# Patient Record
Sex: Female | Born: 1995 | Race: Black or African American | Hispanic: No | Marital: Single | Smoking: Never smoker
Health system: Southern US, Community
[De-identification: ages and names within clinical notes are randomized; demographics above are authoritative.]

## PROBLEM LIST (undated history)

## (undated) DIAGNOSIS — Z973 Presence of spectacles and contact lenses: Secondary | ICD-10-CM

## (undated) DIAGNOSIS — D259 Leiomyoma of uterus, unspecified: Secondary | ICD-10-CM

## (undated) HISTORY — PX: NO PAST SURGERIES: SHX2092

---

## 2015-03-15 ENCOUNTER — Emergency Department (HOSPITAL_COMMUNITY)
Admission: EM | Admit: 2015-03-15 | Discharge: 2015-03-15 | Disposition: A | Discharge status: Home / Self Care | Payer: BLUE CROSS/BLUE SHIELD | Attending: Emergency Medicine | Admitting: Emergency Medicine

## 2015-03-15 ENCOUNTER — Emergency Department (HOSPITAL_COMMUNITY): Payer: BLUE CROSS/BLUE SHIELD

## 2015-03-15 DIAGNOSIS — R002 Palpitations: Secondary | ICD-10-CM | POA: Insufficient documentation

## 2015-03-15 DIAGNOSIS — R079 Chest pain, unspecified: Secondary | ICD-10-CM | POA: Diagnosis not present

## 2015-03-15 DIAGNOSIS — R000 Tachycardia, unspecified: Secondary | ICD-10-CM | POA: Insufficient documentation

## 2015-03-15 DIAGNOSIS — L239 Allergic contact dermatitis, unspecified cause: Secondary | ICD-10-CM | POA: Diagnosis not present

## 2015-03-15 DIAGNOSIS — Z79899 Other long term (current) drug therapy: Secondary | ICD-10-CM | POA: Diagnosis not present

## 2015-03-15 LAB — I-STAT TROPONIN, ED: TROPONIN I, POC: 0 ng/mL (ref 0.00–0.08)

## 2015-03-15 LAB — CBC
HCT: 35.9 % — ABNORMAL LOW (ref 36.0–46.0)
Hemoglobin: 12.4 g/dL (ref 12.0–15.0)
MCH: 31.1 pg (ref 26.0–34.0)
MCHC: 34.5 g/dL (ref 30.0–36.0)
MCV: 90 fL (ref 78.0–100.0)
PLATELETS: 213 10*3/uL (ref 150–400)
RBC: 3.99 MIL/uL (ref 3.87–5.11)
RDW: 12.7 % (ref 11.5–15.5)
WBC: 5.7 10*3/uL (ref 4.0–10.5)

## 2015-03-15 LAB — BASIC METABOLIC PANEL
Anion gap: 6 (ref 5–15)
BUN: 10 mg/dL (ref 6–20)
CALCIUM: 9.1 mg/dL (ref 8.9–10.3)
CHLORIDE: 107 mmol/L (ref 101–111)
CO2: 24 mmol/L (ref 22–32)
CREATININE: 0.75 mg/dL (ref 0.44–1.00)
Glucose, Bld: 93 mg/dL (ref 65–99)
Potassium: 4.2 mmol/L (ref 3.5–5.1)
SODIUM: 137 mmol/L (ref 135–145)

## 2015-03-15 LAB — TROPONIN I

## 2015-03-15 LAB — D-DIMER, QUANTITATIVE: D-Dimer, Quant: 1.58 ug/mL-FEU — ABNORMAL HIGH (ref 0.00–0.48)

## 2015-03-15 MED ORDER — PREDNISONE 20 MG PO TABS
40.0000 mg | ORAL_TABLET | Freq: Once | ORAL | Status: AC
  Administered 2015-03-15: 40 mg via ORAL
  Filled 2015-03-15: qty 2

## 2015-03-15 MED ORDER — PREDNISONE 20 MG PO TABS: 40.0000 mg | ORAL_TABLET | Freq: Every day | ORAL | Status: DC

## 2015-03-15 MED ORDER — IOHEXOL 350 MG/ML SOLN
100.0000 mL | Freq: Once | INTRAVENOUS | Status: AC | PRN
  Administered 2015-03-15: 56 mL via INTRAVENOUS

## 2015-03-15 NOTE — Discharge Instructions (Signed)
Please return if you experience shortness of breath, worsening rash, new or worsening symptoms, or additional concerns. Please take all medications as prescribed.  It is important to have a primary doctor to ensure healthy living. I have attached resources to help you find a provider in the area.    Emergency Department Resource Guide 1) Find a Doctor and Pay Out of Pocket Although you won't have to find out who is covered by your insurance plan, it is a good idea to ask around and get recommendations. You will then need to call the office and see if the doctor you have chosen will accept you as a new patient and what types of options they offer for patients who are self-pay. Some doctors offer discounts or will set up payment plans for their patients who do not have insurance, but you will need to ask so you aren't surprised when you get to your appointment.  2) Contact Your Local Health Department Not all health departments have doctors that can see patients for sick visits, but many do, so it is worth a call to see if yours does. If you don't know where your local health department is, you can check in your phone book. The CDC also has a tool to help you locate your state's health department, and many state websites also have listings of all of their local health departments.  3) Find a Walk-in Clinic If your illness is not likely to be very severe or complicated, you may want to try a walk in clinic. These are popping up all over the country in pharmacies, drugstores, and shopping centers. They're usually staffed by nurse practitioners or physician assistants that have been trained to treat common illnesses and complaints. They're usually fairly quick and inexpensive. However, if you have serious medical issues or chronic medical problems, these are probably not your best option.  No Primary Care Doctor: - Call Health Connect at  [phone] - they can help you locate a primary care doctor that   accepts your insurance, provides certain services, etc. - Physician Referral Service- [phone]  Chronic Pain Problems: Organization         Address  Phone   Notes  [facility]  [phone] Patients need to be referred by their primary care doctor.   Medication Assistance: Organization         Address  Phone   Notes  [facility] Medication [facility] [address] [place], [place] [address] [phone] --Must be a resident of [facility] [place] -- Must have NO insurance coverage whatsoever (no Medicaid/ Medicare, etc.) -- The pt. MUST have a primary care doctor that directs their care regularly and follows them in the community   MedAssist  [phone]   [employer]  [phone]    Agencies that provide inexpensive medical care: Organization         Address  Phone   Notes  [facility]  [phone]   [facility]    [phone]) [phone]   [facility] [address] [place], [place] [address] [phone]   [facility] [address], [place] [phone]   Planned Parenthood    [phone]   [facility]    [phone]   [facility]  [address], [place] Phone:  [phone], Fax:  [phone] Hours of Operation:  9  am - 6 pm, M-F.  Also accepts Medicaid/Medicare and self-pay.  [facility] for [place] [address], [address], [place] Phone: [phone], Fax: [phone]. Hours of Operation:  8:30 am - 5:30 pm, M-F.  Also accepts Medicaid and self-pay.  [facility] [place] [address], [place] Phone: [phone]   [place], [place], [place] [phone], Ext. [phone] Mondays & Thursdays: 7-9 AM.  First 15 patients are seen on a first come, first serve basis.    [place] Providers:  Organization          Address  Phone   Notes  [facility] [address], [place] [phone] Also accepts self-pay patients.  [facility] [address], [place]  [phone]   [place], [address], [place] [phone]   [facility] [address], [place] [phone]   Lucianne Lei [address], [place]   [phone] Only accepts [place] Access [place] patients after they have their name applied to their card.   Self-Pay (no insurance) in [facility]:  Organization         Address  Phone   Notes  Sickle Cell Patients, [facility] [place] [phone]   [facility] [place] [phone]   [facility] [place]  [place], [place], [place] [phone]   [facility]/Dr. Osei-Bonsu  [address], [place] or [address], [place] [phone] Phone number for both [place] and [place] locations is the same.  [facility] [address], [place] [phone]   [facility] [address], [place] or [address] [phone]   [facility] [address], [place] [phone], phone; [phone], fax Sees patients 1st and 3rd Saturday of every month.  Must not qualify for public or private insurance (i.e. Medicaid, Medicare, [place] Health Choice, Veterans' Benefits)  Household income should be no more than 200% of the poverty level The clinic cannot treat you if you are pregnant or think you are pregnant  Sexually transmitted diseases are not treated at the clinic.    Dental Care: Organization         Address  Phone  Notes  [facility] [place] [phone] Accepts children up to age 52 who are enrolled in [place] or [place]; pregnant women with a Medicaid card; and children who have applied for Medicaid or [place] Health Choice, but were declined, whose parents can pay a reduced fee at time of service.  [facility] Department of [facility]  [address], [place] [phone] Accepts children up to age 78 who are enrolled in [place] or [place]; pregnant women with a Medicaid card; and children who have applied for Medicaid or  Health Choice, but were declined, whose parents can pay a reduced fee at time of service.  [facility] Access PRO[facility]  [place] [phone] Patients are seen by appointment only. Walk-ins are not accepted. [place] will see patients 18 years of  age and older. Monday - Tuesday (8am-5pm) Most Wednesdays (8:30-5pm) $30 per visit, cash only  [facility] Adult Dental Access PROGRAM  [address], [facility] [phone] Patients are seen by appointment only. Walk-ins are not accepted. [place] will see patients 57 years of age and older. One Wednesday Evening (Monthly: Volunteer Based).  $30 per visit, cash only  [place]  [phone] for adults; Children under age 78, call Graduate Pediatric Dentistry at [phone]. Children aged 43-14, please call [phone] to request a pediatric application.  Dental services are provided in all areas of dental care including fillings, crowns and bridges, complete and partial dentures, implants, gum treatment, root canals, and extractions. Preventive care is also provided. Treatment is provided to both adults and children. Patients are selected via a lottery and there is often a waiting list.   [facility] [address] [place]  [phone] [email or web address]   [facility] [address] [place], [place]  [phone], Ext. [phone] Second and Fourth Thursday of each month, opens at 6:30 AM; Clinic ends at 9 AM.  Patients are seen on a first-come first-served basis, and a limited number are seen during each clinic.   [facility]  [address] Hillard Danker [place], [place] [phone]   Eligibility Requirements You must have lived in [place], [place], or [place] counties for at least the last three months.   You cannot be eligible for state or federal sponsored [employer], including [employer], [place], or [employer].   You generally cannot be eligible for healthcare insurance through your employer.    How to apply: Eligibility screenings are held every Tuesday and Wednesday afternoon from 1:00 pm until 4:00 pm. You do not need an appointment for the interview!  [facility] [address] [place], [place]   [place]  [place] Department  [place]  [phone]    Behavioral Health Resources in the Community: Intensive Outpatient Programs Organization         Address  Phone  Notes  [place]. [address], [place], [place] [phone]   [facility] Outpatient [address], [place], [place]   ADS: Alcohol & Drug Svcs [address] [place], [place]   [place] [address],  [place], [place] or [phone]   Substance Abuse Resources Organization         Address  Phone  Notes  Alcohol and Drug Services  [phone]   [place]  [phone]   The [place]   Chinita Pester  [phone]   Residential & Outpatient Substance Abuse Program  [phone]   Psychological Services Organization         Address  Phone  Notes  [facility] [place]  [place]  [phone]    [facility] [address] [place] or [phone]    Mobile Crisis Teams Organization         Address  Phone  Notes  [facility], Mobile Crisis Care Unit  [phone]   [facility] Psychotherapeutic Services  [address] [place], [place]   [facility] [address] [place] [phone]    Self-Help/Support Groups Organization  Address  Phone             Notes  [place]. of [place] - variety of support groups  [place] Call for more information  Narcotics Anonymous (NA), [facility]aring [facility] [address], [employer] [place]  2 meetings at this location   Special educational needs teacher         Address  Phone  Notes  [facility]idential [facility] [place],    [place]  [phone]   [facility]  [address] [place] [address]250, [place], [place]   [place], [place] [phone] Admissions: 8am-3pm M-F  Incentives Substance [place] [address],    [place], [place] [phone]   [facility] [address] [place], [place], [place]   [facility] [address]  [place], [place]   [facility]ight Programs - Intensive Outpatient [address], [place], [place]   [facility] ([place].,  [place], [place] [phone] or [phone]   [facility]idential Treatment Services (RTS) [address] [place], [place] Accepts Medicaid  [facility] [place] [address]  [place] [phone] Substance Abuse/Addiction Treatment   [facility] Organization         Address  Phone  Notes  [facility]  [phone]   Domenic Schwab, PhD [address] Arlis [place], [place]   [phone] or [phone]   [place]  [place], [place] [phone]   [facility] [address] [place], [place] [phone] Insurance/Medicaid/sponsorship through [facility] and Families [address] [place]                                    [place], [place] [phone] [place] [address], [place] [phone]    Dr. Adele Schilder  [phone]   [facility] Dept. 1) [address], [place] 2) [place] 3)  [address], [place] [phone]  [phone]   [place] [phone] or [phone] (After Hours)

## 2015-03-15 NOTE — ED Provider Notes (Signed)
The pt has central CP - started while she is class.  No hx of CAD or other RF for PE / ACS.   Has clear lungs and heart sounds, no edema,  The patient does use oral contraceptive pills, she is low risk for other sources of chest pain, will add d-dimer, labs and x-ray reviewed and thus far are normal.  ED ECG REPORT  I personally interpreted this EKG   Date: 03/16/2015   Rate: 82  Rhythm: normal sinus rhythm  QRS Axis: normal  Intervals: normal  ST/T Wave abnormalities: normal  Conduction Disutrbances:none  Narrative Interpretation:   Old EKG Reviewed: none available  Medical screening examination/treatment/procedure(s) were conducted as a shared visit with non-physician practitioner(s) and myself.  I personally evaluated the patient during the encounter.  Clinical Impression:   Final diagnoses:  Chest pain, unspecified chest pain type  Allergic dermatitis         Eber HongBrian Aylee Littrell, MD 03/16/15 [phone]

## 2015-03-15 NOTE — ED Provider Notes (Signed)
CSN: [id number]     Arrival date & time 03/15/15  1159 History   First MD Initiated Contact with Patient 03/15/15 1209     Chief Complaint  Patient presents with  . Chest Pain     (Consider location/radiation/quality/duration/timing/severity/associated sxs/prior Treatment) Patient is a 19 y.o. female presenting with chest pain. The history is provided by the patient and medical records. No language interpreter was used.  Chest Pain Pain location:  Substernal area Pain quality: throbbing   Pain radiates to:  L arm Pain radiates to the back: no   Onset quality:  Sudden Timing:  Intermittent Progression:  Resolved Chronicity:  New Relieved by:  None tried Worsened by:  Movement Ineffective treatments:  None tried Associated symptoms: palpitations   Associated symptoms: no abdominal pain, no back pain, no cough, no dizziness, no headache, no nausea, no shortness of breath, not vomiting and no weakness   Risk factors: birth control   Radiation of pain in left arm is vague, not as severe, and does not always occur.     No past medical history on file. No past surgical history on file. No family history on file. Social History  Substance Use Topics  . Smoking status: Not on file  . Smokeless tobacco: Not on file  . Alcohol Use: Not on file   OB History    No data available     Review of Systems  Constitutional: Negative.   HENT: Negative for congestion, hearing loss, rhinorrhea and sore throat.   Eyes: Negative for visual disturbance.  Respiratory: Negative for cough, shortness of breath and wheezing.   Cardiovascular: Positive for chest pain and palpitations.  Gastrointestinal: Negative for nausea, vomiting, abdominal pain, diarrhea and constipation.  Endocrine: Negative for polydipsia and polyuria.  Musculoskeletal: Negative for myalgias, back pain, arthralgias and neck pain.  Skin: Negative for rash.  Neurological: Negative for dizziness, weakness and headaches.       Allergies  Review of patient's allergies indicates not on file.  Home Medications   Prior to Admission medications   Medication Sig Start Date End Date Taking? Authorizing Provider  JUNEL FE 1/20 1-20 MG-MCG tablet Take 1 tablet by mouth daily. 02/19/15  Yes Historical Provider, MD   BP 111/64 mmHg  Pulse 83  Temp(Src) 99.1 F (37.3 C) (Oral)  Resp 19  Ht  (1.6 m)  Wt 123 lb (55.792 kg)  BMI 21.79 kg/m2  SpO2 100%  LMP 02/22/2015 (Exact Date) Physical Exam  Constitutional: She is oriented to person, place, and time. She appears well-developed and well-nourished.  Alert and in no acute distress  HENT:  Head: Normocephalic and atraumatic.  Cardiovascular: Normal rate, regular rhythm, normal heart sounds and intact distal pulses.  Exam reveals no gallop and no friction rub.   No murmur heard. On re-eval exam, tacky but regular.  Pulmonary/Chest: Effort normal and breath sounds normal. No respiratory distress. She has no wheezes. She has no rales. She exhibits no tenderness.  Abdominal: Soft. Bowel sounds are normal. She exhibits no mass. There is no rebound and no guarding.  Abdomen soft, non-tender, non-distended Bowel sounds positive in all four quadrants   Musculoskeletal: She exhibits no edema.  Neurological: She is alert and oriented to person, place, and time.  Skin: Skin is warm and dry. No rash noted.  Psychiatric: She has a normal mood and affect. Her behavior is normal. Judgment and thought content normal.    ED Course  Procedures (including critical care time) Labs  Review Labs Reviewed  CBC - Abnormal; Notable for the following:    HCT 35.9 (*)    All other components within normal limits  D-DIMER, QUANTITATIVE (NOT AT [facility]) - Abnormal; Notable for the following:    D-Dimer, Quant 1.58 (*)    All other components within normal limits  BASIC METABOLIC PANEL  TROPONIN I  [place] Sensor, ED    Imaging Review Dg Chest 2 View  03/15/2015   CLINICAL DATA:  Initial encounter:  Chest pain EXAM: CHEST  2 VIEW COMPARISON:  None. FINDINGS: Lungs are clear. Heart size and pulmonary vascularity are normal. No adenopathy. No pneumothorax. No focal bone lesions. There is slight mid thoracic dextroscoliosis. IMPRESSION: No edema or consolidation. Electronically Signed   By: Bretta Bang III M.D.   On: 03/15/2015 13:28   Ct Angio Chest Pe W/cm &/or Wo Cm  03/15/2015  CLINICAL DATA:  Pain in the mid and upper chest radiating to the left arm, with sudden onset this morning. EXAM: CT ANGIOGRAPHY CHEST WITH CONTRAST TECHNIQUE: Multidetector CT imaging of the chest was performed using the standard protocol during bolus administration of intravenous contrast. Multiplanar CT image reconstructions and MIPs were obtained to evaluate the vascular anatomy. CONTRAST:  56mL OMNIPAQUE IOHEXOL 350 MG/ML SOLN COMPARISON:  Chest radiograph dated 03/15/2015 FINDINGS: There is no evidence of pulmonary embolus. There is mild pericardial thickening at the apex of the heart. There is no evidence of significant focal lung parenchymal consolidation, pleural effusion or pneumothorax. No masses are identified ; no abnormal focal contrast enhancement is seen. No mediastinal or hilar lymphadenopathy. No axillary lymphadenopathy is seen. The visualized portions of the thyroid gland are unremarkable in appearance. The visualized portions of the liver and spleen are unremarkable. The visualized portions of the pancreas, gallbladder, stomach, adrenal glands and kidneys are within normal limits. No acute osseous abnormalities are seen. Review of the MIP images confirms the above findings. IMPRESSION: No evidence of pulmonary embolus. Minimal pericardial thickening at the apex of the heart. Please correlate clinically. No evidence of pulmonary disease. Electronically Signed   By: Ted Mcalpine M.D.   On: 03/15/2015 15:33   I have personally reviewed and evaluated these images and  lab results as part of my medical decision-making.   EKG Interpretation   Date/Time:  Wednesday March 15 2015 12:18:38 EDT Ventricular Rate:  82 PR Interval:  157 QRS Duration: 66 QT Interval:  342 QTC Calculation: 399 R Axis:   72 Text Interpretation:  Sinus rhythm Normal ECG No old tracing to compare  Confirmed by MILLER  MD, BRIAN ([id number]) on 03/15/2015 3:33:50 PM      MDM   Final diagnoses:  None   Donna Faulkner presents to ED for chest pain. Not exertional, intermittent, not currently in pain, no cardiac history, no cardiac risk factors. HEART score of 0 - cardiac etiology unlikely.   EKG, CXR and labs reviewed; no delta wave on EKG, CXR with no acute cardiopulm process, Troponin negative, CBC WDL other than HCT of 35.9   1:36 PM - Pt. Re-evaluated, tacky at 108 while in room. Considering this HR, Well's score is 1.5 (low-risk) ; Patient had an episode of discomfort while here that resolved in less than one minute. Patient on OCP - will add d-dimer to rule out PE since low risk but no known etiology of pain at this time.   2:33 PM - D dimer returned + 1.58, will do CT angio to rule out PE 2:45 PM -  Patient re-evaluated; 100% o2, no SOB. Results of d-dimer discussed, patient verbalizes understanding and agrees CT.   3:11 PM - Nurse informs that patient is complaining of rash. Patient re-evaluated and swelling, erythema of left perioral region and neck is noted. + itching. This occurred before IV contrast was given. No new soaps, lotions, hair products, etc. Will give prednisone   3:41 PM - CT reviewed no evidence of PE. Discussed discharge with patient and informed she and mother of reasons to return. Both verbalized understanding.   Chase PicketJaime Pilcher Ward, PA-C        [facility]oscopy[facility] Pilcher Ward, [place] JerseyPA-C 03/15/15 1546  Eber HongBrian Miller, MD 03/16/15 [phone]

## 2015-03-15 NOTE — ED Notes (Addendum)
Per pt, new onset chest pain 6/10 during class at 10am today. Pain in mid upper chest radiating to left arm at times. No medical hx.

## 2016-09-03 IMAGING — CR DG CHEST 2V
2 series · 2 of 2 positions shown · non-contrast
Comparison: None.

CLINICAL DATA: Initial encounter:  Chest pain

EXAM:
CHEST  2 VIEW

[w chest pa]
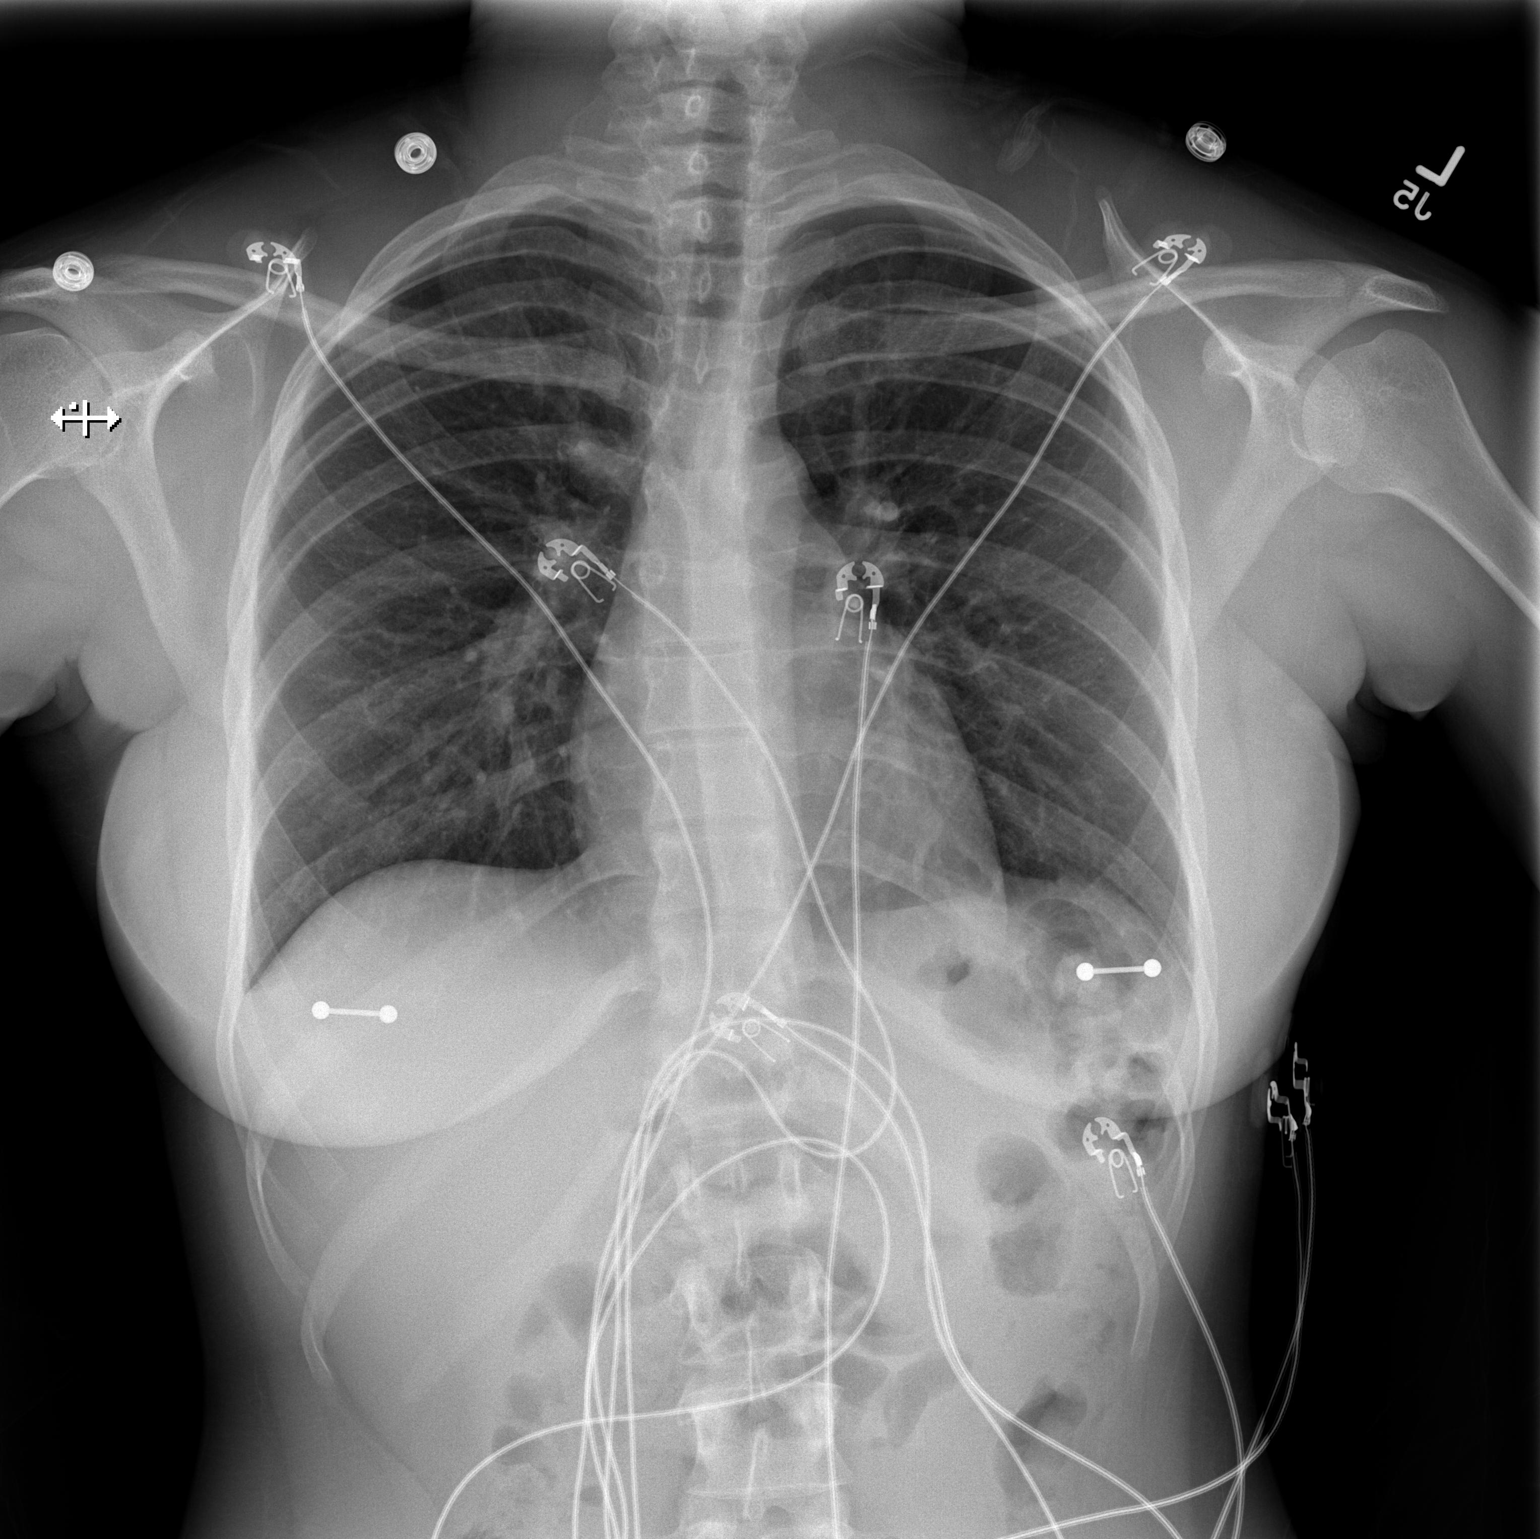

[w chest lat]
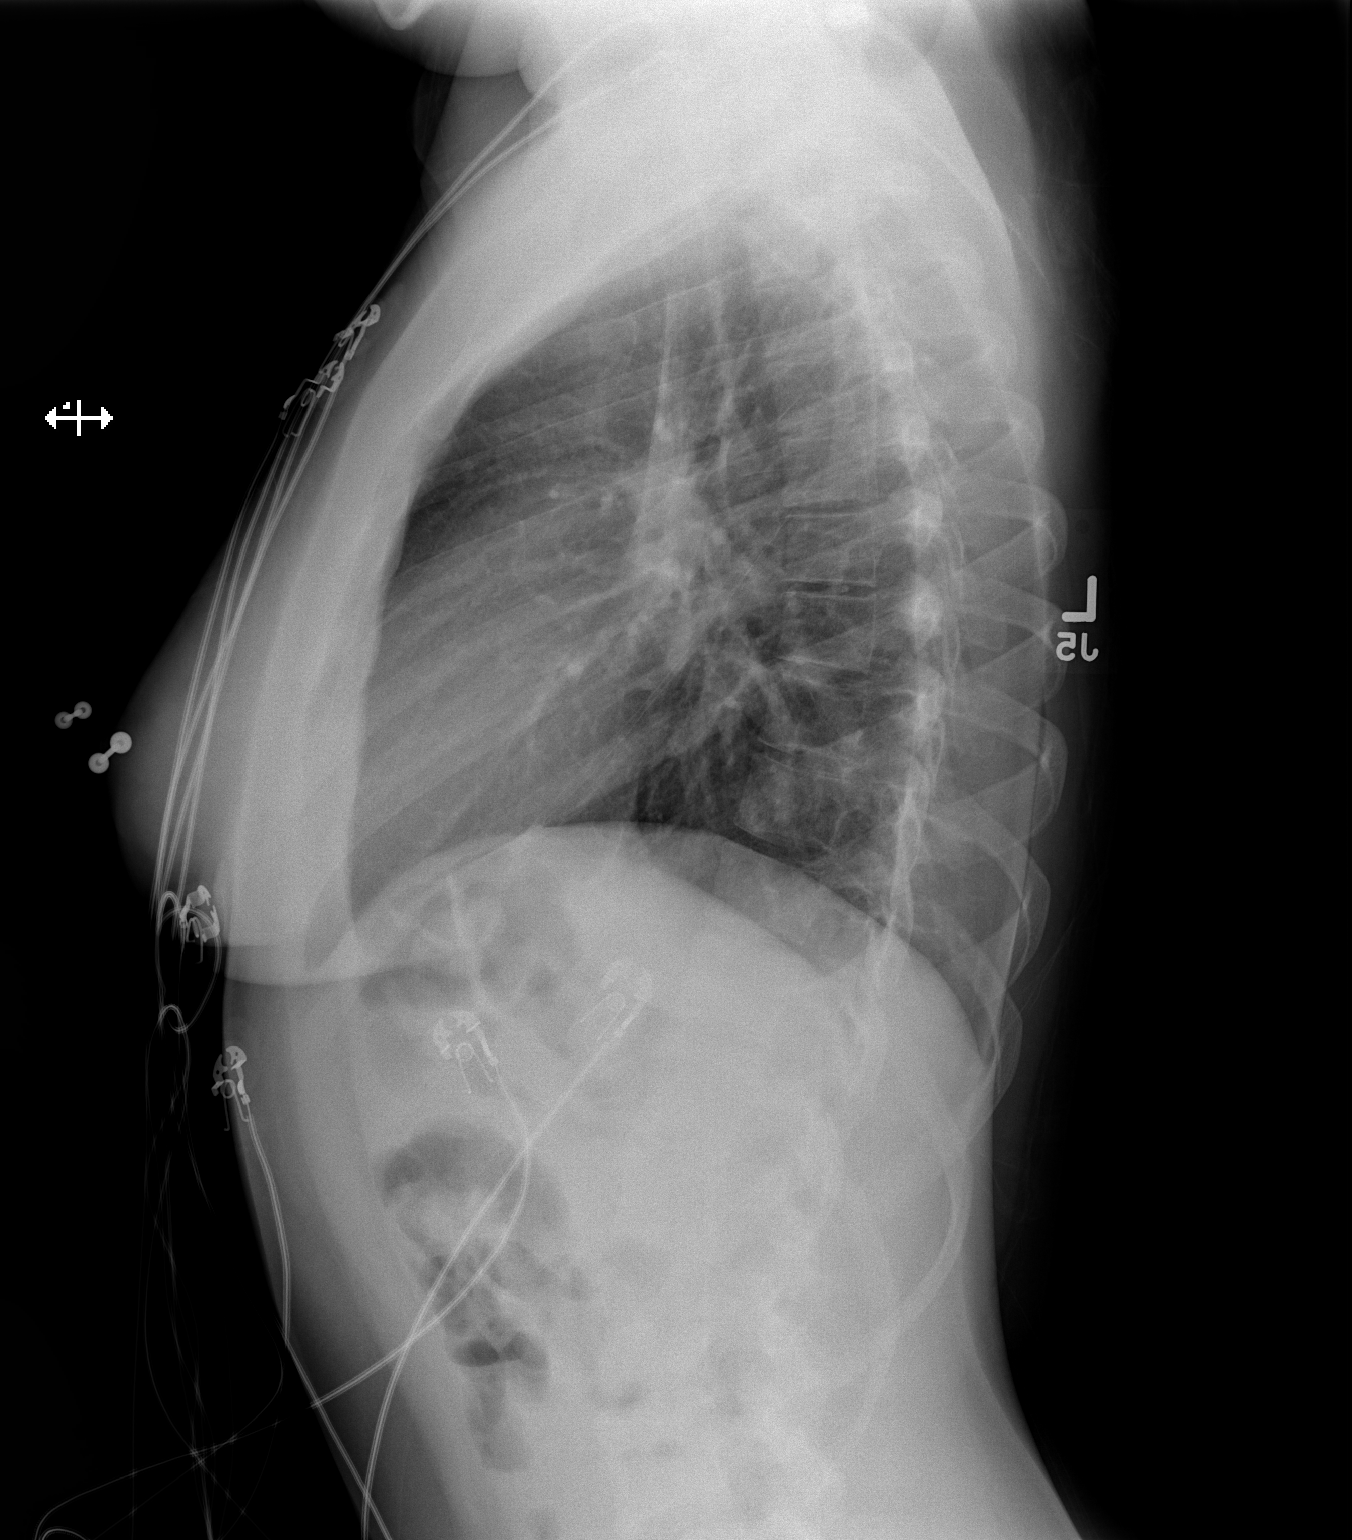

[2 of 2 positions shown; findings below may reference images not displayed]

FINDINGS: Lungs are clear. Heart size and pulmonary vascularity are normal. No
adenopathy. No pneumothorax. No focal bone lesions. There is slight
mid thoracic dextroscoliosis.
IMPRESSION: No edema or consolidation.

## 2018-05-20 DIAGNOSIS — Z87442 Personal history of urinary calculi: Secondary | ICD-10-CM

## 2018-05-20 HISTORY — DX: Personal history of urinary calculi: Z87.442

## 2019-02-13 ENCOUNTER — Emergency Department (HOSPITAL_COMMUNITY): Payer: Managed Care, Other (non HMO)

## 2019-02-13 ENCOUNTER — Other Ambulatory Visit: Payer: Self-pay

## 2019-02-13 ENCOUNTER — Other Ambulatory Visit: Payer: Self-pay | Admitting: Physician Assistant

## 2019-02-13 ENCOUNTER — Emergency Department (HOSPITAL_COMMUNITY)
Admission: EM | Admit: 2019-02-13 | Discharge: 2019-02-13 | Disposition: A | Discharge status: Home / Self Care | Payer: Managed Care, Other (non HMO) | Attending: Emergency Medicine | Admitting: Emergency Medicine

## 2019-02-13 ENCOUNTER — Encounter (HOSPITAL_COMMUNITY): Payer: Self-pay | Admitting: Emergency Medicine

## 2019-02-13 DIAGNOSIS — N200 Calculus of kidney: Secondary | ICD-10-CM | POA: Diagnosis not present

## 2019-02-13 DIAGNOSIS — N760 Acute vaginitis: Secondary | ICD-10-CM | POA: Insufficient documentation

## 2019-02-13 DIAGNOSIS — Z113 Encounter for screening for infections with a predominantly sexual mode of transmission: Secondary | ICD-10-CM | POA: Insufficient documentation

## 2019-02-13 DIAGNOSIS — E876 Hypokalemia: Secondary | ICD-10-CM | POA: Diagnosis not present

## 2019-02-13 DIAGNOSIS — R1011 Right upper quadrant pain: Secondary | ICD-10-CM | POA: Diagnosis present

## 2019-02-13 DIAGNOSIS — Z79899 Other long term (current) drug therapy: Secondary | ICD-10-CM | POA: Diagnosis not present

## 2019-02-13 DIAGNOSIS — B9689 Other specified bacterial agents as the cause of diseases classified elsewhere: Secondary | ICD-10-CM

## 2019-02-13 LAB — CBC
HCT: 36.3 % (ref 36.0–46.0)
Hemoglobin: 11.7 g/dL — ABNORMAL LOW (ref 12.0–15.0)
MCH: 30.1 pg (ref 26.0–34.0)
MCHC: 32.2 g/dL (ref 30.0–36.0)
MCV: 93.3 fL (ref 80.0–100.0)
Platelets: 268 10*3/uL (ref 150–400)
RBC: 3.89 MIL/uL (ref 3.87–5.11)
RDW: 12.4 % (ref 11.5–15.5)
WBC: 7.3 10*3/uL (ref 4.0–10.5)
nRBC: 0 % (ref 0.0–0.2)

## 2019-02-13 LAB — I-STAT BETA HCG BLOOD, ED (MC, WL, AP ONLY): I-stat hCG, quantitative: <5 m[IU]/mL (ref <5)

## 2019-02-13 LAB — URINALYSIS, ROUTINE W REFLEX MICROSCOPIC
Bacteria, UA: NONE SEEN
Bilirubin Urine: NEGATIVE
Glucose, UA: NEGATIVE mg/dL
Ketones, ur: NEGATIVE mg/dL
Nitrite: NEGATIVE
Protein, ur: NEGATIVE mg/dL
Specific Gravity, Urine: 1.015 (ref 1.005–1.030)
pH: 5 (ref 5.0–8.0)

## 2019-02-13 LAB — LIPASE, BLOOD: Lipase: 20 U/L (ref 11–51)

## 2019-02-13 LAB — WET PREP, GENITAL
Sperm: NONE SEEN
Trich, Wet Prep: NONE SEEN
Yeast Wet Prep HPF POC: NONE SEEN

## 2019-02-13 LAB — COMPREHENSIVE METABOLIC PANEL
ALT: 14 U/L (ref 0–44)
AST: 20 U/L (ref 15–41)
Albumin: 3.9 g/dL (ref 3.5–5.0)
Alkaline Phosphatase: 61 U/L (ref 38–126)
Anion gap: 9 (ref 5–15)
BUN: 13 mg/dL (ref 6–20)
CO2: 24 mmol/L (ref 22–32)
Calcium: 9.1 mg/dL (ref 8.9–10.3)
Chloride: 105 mmol/L (ref 98–111)
Creatinine, Ser: 0.8 mg/dL (ref 0.44–1.00)
GFR calc Af Amer: >60 mL/min (ref >60)
GFR calc non Af Amer: >60 mL/min (ref >60)
Glucose, Bld: 125 mg/dL — ABNORMAL HIGH (ref 70–99)
Potassium: 3.2 mmol/L — ABNORMAL LOW (ref 3.5–5.1)
Sodium: 138 mmol/L (ref 135–145)
Total Bilirubin: 0.8 mg/dL (ref 0.3–1.2)
Total Protein: 7.4 g/dL (ref 6.5–8.1)

## 2019-02-13 MED ORDER — ONDANSETRON 4 MG PO TBDP
4.0000 mg | ORAL_TABLET | Freq: Once | ORAL | Status: AC | PRN
  Administered 2019-02-13: 4 mg via ORAL
  Filled 2019-02-13: qty 1

## 2019-02-13 MED ORDER — POTASSIUM CHLORIDE CRYS ER 20 MEQ PO TBCR
40.0000 meq | EXTENDED_RELEASE_TABLET | Freq: Once | ORAL | Status: AC
  Administered 2019-02-13: 40 meq via ORAL
  Filled 2019-02-13: qty 2

## 2019-02-13 MED ORDER — SODIUM CHLORIDE 0.9 % IV BOLUS
1000.0000 mL | Freq: Once | INTRAVENOUS | Status: AC
  Administered 2019-02-13: 1000 mL via INTRAVENOUS

## 2019-02-13 MED ORDER — SODIUM CHLORIDE 0.9 % IV BOLUS: 1000.0000 mL | Freq: Once | INTRAVENOUS | Status: DC

## 2019-02-13 MED ORDER — METRONIDAZOLE 500 MG PO TABS: 500.0000 mg | ORAL_TABLET | Freq: Two times a day (BID) | ORAL | 0 refills | Status: AC

## 2019-02-13 MED ORDER — SODIUM CHLORIDE 0.9% FLUSH
3.0000 mL | Freq: Once | INTRAVENOUS | Status: AC
  Administered 2019-02-13: 3 mL via INTRAVENOUS

## 2019-02-13 MED ORDER — ONDANSETRON HCL 4 MG PO TABS: 4.0000 mg | ORAL_TABLET | Freq: Four times a day (QID) | ORAL | 0 refills | Status: DC

## 2019-02-13 MED ORDER — TAMSULOSIN HCL 0.4 MG PO CAPS
0.4000 mg | ORAL_CAPSULE | Freq: Once | ORAL | Status: AC
  Administered 2019-02-13: 10:00:00 0.4 mg via ORAL
  Filled 2019-02-13: qty 1

## 2019-02-13 MED ORDER — POTASSIUM CHLORIDE ER 10 MEQ PO TBCR: 10.0000 meq | EXTENDED_RELEASE_TABLET | Freq: Every day | ORAL | 0 refills | Status: DC

## 2019-02-13 MED ORDER — KETOROLAC TROMETHAMINE 30 MG/ML IJ SOLN
30.0000 mg | Freq: Once | INTRAMUSCULAR | Status: AC
  Administered 2019-02-13: 08:00:00 30 mg via INTRAVENOUS
  Filled 2019-02-13: qty 1

## 2019-02-13 MED ORDER — TAMSULOSIN HCL 0.4 MG PO CAPS: 0.4000 mg | ORAL_CAPSULE | Freq: Every day | ORAL | 0 refills | Status: DC

## 2019-02-13 MED ORDER — ONDANSETRON HCL 4 MG/2ML IJ SOLN
4.0000 mg | Freq: Once | INTRAMUSCULAR | Status: AC
  Administered 2019-02-13: 08:00:00 4 mg via INTRAVENOUS
  Filled 2019-02-13: qty 2

## 2019-02-13 MED ORDER — KETOROLAC TROMETHAMINE 15 MG/ML IJ SOLN
15.0000 mg | Freq: Once | INTRAMUSCULAR | Status: AC
  Administered 2019-02-13: 10:00:00 15 mg via INTRAVENOUS
  Filled 2019-02-13: qty 1

## 2019-02-13 MED ORDER — HYDROCODONE-ACETAMINOPHEN 5-325 MG PO TABS: 2.0000 | ORAL_TABLET | ORAL | 0 refills | Status: DC | PRN

## 2019-02-13 NOTE — ED Notes (Signed)
Patient has tolerated water w/o any complaints or difficulties.

## 2019-02-13 NOTE — ED Triage Notes (Signed)
Patient here from home with complaints of nausea and right sided flank pain that started 2 hours ago. Actively vomiting in triage.

## 2019-02-13 NOTE — ED Provider Notes (Signed)
[facility]-EMERGENCY DEPT Provider Note   CSN: [id number] Arrival date & time: 02/13/19  0249     History   Chief Complaint Chief Complaint  Patient presents with   Flank Pain   Emesis    HPI Donna Faulkner is a 23 y.o. female.     HPI   Patient is a 23 year old female with no known past medical history presents the emergency department today for evaluation of right-sided abdominal pain.  Pain located to the right upper abdomen but radiates down to the right lower abdomen.  Started suddenly and has been constant since onset.  She describes it as a sharp, persistent cramps.  She rates her pain 7/10.  She tried Aleve at home but is not sure if it helped her symptoms or if she was able to keep it down because she started having nausea and vomiting shortly after taking it.  She has had about 5-6 episodes of vomiting.  She denies any associated constipation, diarrhea, dysuria, frequency, urgency, hematuria, vaginal discharge.  She is on her menstrual cycle still has had some vaginal bleeding.  She states this feels different than menstrual cramps.  Denies any radiation of pain to the right flank.  Denies any history of abdominal surgeries.  Drinks alcohol about 3 times per week.  Denies any drug use.  Denies any associated fevers.  She is never had similar symptoms.  History reviewed. No pertinent past medical history.  There are no active problems to display for this patient.   History reviewed. No pertinent surgical history.   OB History   No obstetric history on file.      Home Medications    Prior to Admission medications   Medication Sig Start Date End Date Taking? Authorizing Provider  JUNEL FE 1/20 1-20 MG-MCG tablet Take 1 tablet by mouth daily. 02/19/15  Yes [provider]  naproxen sodium (ALEVE) 220 MG tablet Take 440 mg by mouth 2 (two) times daily as needed (pain/headache).   Yes [provider]  HYDROcodone-acetaminophen  (NORCO/VICODIN) 5-325 MG tablet Take 2 tablets by mouth every 4 (four) hours as needed. 02/13/19   Nereyda Bowler S, PA-C  metroNIDAZOLE (FLAGYL) 500 MG tablet Take 1 tablet (500 mg total) by mouth 2 (two) times daily for 7 days. 02/13/19 02/20/19  Arli Bree S, PA-C  ondansetron (ZOFRAN) 4 MG tablet Take 1 tablet (4 mg total) by mouth every 6 (six) hours. 02/13/19   Lavarius Doughten S, PA-C  potassium chloride (K-DUR) 10 MEQ tablet Take 1 tablet (10 mEq total) by mouth daily for 5 days. 02/13/19 02/18/19  Scott Fix S, PA-C  predniSONE (DELTASONE) 20 MG tablet Take 2 tablets (40 mg total) by mouth daily. Patient not taking: Reported on 02/13/2019 03/15/15   Ward, Chase PicketJaime Pilcher, PA-C  tamsulosin (FLOMAX) 0.4 MG CAPS capsule Take 1 capsule (0.4 mg total) by mouth daily. 02/13/19   Kalianna Verbeke S, PA-C    Family History No family history on file.  Social History Social History   Tobacco Use   Smoking status: Never Smoker   Smokeless tobacco: Never Used  Substance Use Topics   Alcohol use: Never    Frequency: Never   Drug use: Never     Allergies   Patient has no known allergies.   Review of Systems Review of Systems  Constitutional: Negative for chills and fever.  HENT: Negative for ear pain and sore throat.   Eyes: Negative for visual disturbance.  Respiratory: Negative for  cough and shortness of breath.   Cardiovascular: Negative for chest pain.  Gastrointestinal: Positive for abdominal pain, nausea and vomiting. Negative for constipation and diarrhea.  Genitourinary: Positive for vaginal bleeding (on menses). Negative for dysuria, flank pain, hematuria, urgency and vaginal discharge.  Musculoskeletal: Negative for back pain.  Skin: Negative for rash.  Neurological: Negative for headaches.  All other systems reviewed and are negative.    Physical Exam Updated Vital Signs BP 124/83 (BP Location: Right Arm)    Pulse 83    Temp 98.3 F (36.8 C) (Oral)    Resp 17     Ht 5\' 2"  (1.575 m)    Wt 56.2 kg    SpO2 100%    BMI 22.64 kg/m   Physical Exam Vitals signs and nursing note reviewed.  Constitutional:      General: She is not in acute distress.    Appearance: She is well-developed. She is not ill-appearing or toxic-appearing.  HENT:     Head: Normocephalic and atraumatic.  Eyes:     Conjunctiva/sclera: Conjunctivae normal.  Neck:     Musculoskeletal: Neck supple.  Cardiovascular:     Rate and Rhythm: Normal rate and regular rhythm.     Pulses: Normal pulses.     Heart sounds: Normal heart sounds. No murmur.  Pulmonary:     Effort: Pulmonary effort is normal. No respiratory distress.     Breath sounds: Normal breath sounds. No wheezing, rhonchi or rales.  Abdominal:     General: Bowel sounds are normal.     Palpations: Abdomen is soft.     Tenderness: There is abdominal tenderness in the right upper quadrant and right lower quadrant. There is guarding (voluntary). There is no right CVA tenderness, left CVA tenderness or rebound.  Genitourinary:    Comments: Exam performed by ,  exam chaperoned Date: 02/13/2019 Pelvic exam: normal external genitalia without evidence of trauma. VULVA: normal appearing vulva with no masses, tenderness or lesion. VAGINA: normal appearing vagina with normal color and discharge, no lesions. CERVIX: normal appearing cervix without lesions, cervical motion tenderness absent, cervical os closed with out purulent discharge; vaginal discharge - not present, Wet prep and DNA probe for chlamydia and GC obtained.   ADNEXA: normal adnexa in size, nontender and no masses UTERUS: uterus is normal size, shape, consistency and nontender.  Skin:    General: Skin is warm and dry.  Neurological:     Mental Status: She is alert.      ED Treatments / Results  Labs (all labs ordered are listed, but only abnormal results are displayed) Labs Reviewed  WET PREP, GENITAL - Abnormal; Notable for the following  components:      Result Value   Clue Cells Wet Prep HPF POC PRESENT (*)    WBC, Wet Prep HPF POC MODERATE (*)    All other components within normal limits  URINALYSIS, ROUTINE W REFLEX MICROSCOPIC - Abnormal; Notable for the following components:   APPearance HAZY (*)    Hgb urine dipstick LARGE (*)    Leukocytes,Ua SMALL (*)    All other components within normal limits  COMPREHENSIVE METABOLIC PANEL - Abnormal; Notable for the following components:   Potassium 3.2 (*)    Glucose, Bld 125 (*)    All other components within normal limits  CBC - Abnormal; Notable for the following components:   Hemoglobin 11.7 (*)    All other components within normal limits  URINE CULTURE  LIPASE, BLOOD  I-STAT BETA HCG BLOOD, ED ([facility], [facility], [facility] ONLY)  GC/CHLAMYDIA PROBE AMP ([place]) NOT AT [facility]    EKG None  Radiology Ct Renal Stone Study  Result Date: 02/13/2019 CLINICAL DATA:  Right-sided flank pain.  Evaluate for kidney stone. EXAM: CT ABDOMEN AND PELVIS WITHOUT CONTRAST TECHNIQUE: Multidetector CT imaging of the abdomen and pelvis was performed following the standard protocol without IV contrast. COMPARISON:  None. FINDINGS: Lower chest: Unremarkable. Hepatobiliary: No focal abnormality in the liver on this study without intravenous contrast. There is no evidence for gallstones, gallbladder wall thickening, or pericholecystic fluid. No intrahepatic or extrahepatic biliary dilation. Pancreas: No focal mass lesion. No dilatation of the main duct. No intraparenchymal cyst. No peripancreatic edema. Spleen: No splenomegaly. No focal mass lesion. Adrenals/Urinary Tract: No adrenal nodule or mass. No stones are seen in either kidney. No secondary changes in either kidney. No definite ureteral stone although there is a stone in the right pelvis adjacent to the iliac vessels. There is no discernible ureter in this region. Lack of secondary changes in the right kidney make ureteral stone less likely, but this  cannot be excluded. Alternatively, this could be a phlebolith. Multiple stones are seen in the left pelvis but these project either more inferior or more lateral than would be expected for the course of the ureter. No hydroureteronephrosis. No evidence for bladder stone. Stomach/Bowel: Stomach is unremarkable. No gastric wall thickening. No evidence of outlet obstruction. Duodenum is normally positioned as is the ligament of Treitz. No small bowel wall thickening. No small bowel dilatation. Terminal ileum not discretely visualized. The appendix is air-filled and normal. No gross colonic mass. No colonic wall thickening. Vascular/Lymphatic: No abdominal aortic aneurysm. No abdominal aortic atherosclerotic calcification. No abdominal lymphadenopathy. No pelvic sidewall lymphadenopathy. Reproductive: Retroflexed uterus.  There is no adnexal mass. Other: Trace free fluid along the uterine fundus. Musculoskeletal: No worrisome lytic or sclerotic osseous abnormality. IMPRESSION: No definite urinary stone disease although a 3 x 5 mm stone in the right pelvis could potentially be in the distal right ureter. No secondary changes in the right kidney or ureter to support this, but ureteral anatomy and venous anatomy in this region is not well demonstrated and it cannot be determined whether this is a phlebolith or renal stone. Follow-up postcontrast CT with delayed imaging through the pelvis (hematuria protocol) could be used to further evaluate as clinically warranted. Electronically Signed   By: Misty Stanley M.D.   On: 02/13/2019 06:48    Procedures Procedures (including critical care time)  Medications Ordered in ED Medications  sodium chloride flush (NS) 0.9 % injection 3 mL (3 mLs Intravenous Given 02/13/19 0735)  ondansetron (ZOFRAN-ODT) disintegrating tablet 4 mg (4 mg Oral Given 02/13/19 0423)  ketorolac (TORADOL) 30 MG/ML injection 30 mg (30 mg Intravenous Given 02/13/19 0735)  sodium chloride 0.9 % bolus  1,000 mL (1,000 mLs Intravenous New Bag/Given 02/13/19 0739)  ondansetron (ZOFRAN) injection 4 mg (4 mg Intravenous Given 02/13/19 0744)  tamsulosin (FLOMAX) capsule 0.4 mg (0.4 mg Oral Given 02/13/19 0947)  ketorolac (TORADOL) 15 MG/ML injection 15 mg (15 mg Intravenous Given 02/13/19 0946)  potassium chloride SA (K-DUR) CR tablet 40 mEq (40 mEq Oral Given 02/13/19 0956)     Initial Impression / Assessment and Plan / ED Course  I have reviewed the triage vital signs and the nursing notes.  Pertinent labs & imaging results that were available during my care of the patient were reviewed by me and considered in  my medical decision making (see chart for details).     Final Clinical Impressions(s) / ED Diagnoses   Final diagnoses:  Kidney stone  Bacterial vaginitis  Hypokalemia   23 year old female presenting with right-sided abdominal pain.  Pain located to the right upper quadrant, radiates to the right lower quadrant.  Associated with nausea and vomiting and no other symptoms.  On arrival, normal vital signs. Mild TTP to the RUQ and RLQ with voluntary guarding. No CVA TTP. Pelvic exam with no uterine/adnexal/cervical motion tenderness to suggest pelvic process.   Reviewed labs and imaging ordered in triage, CBC is without leukocytosis.  Mild anemia present. CMP with mild hypokalemia, otherwise normal kidney and liver function.   Lipase is normal Beta-hCG negative UA with hematuria, small leukocytes, 0-5 RBCs and WBCs, no bacteria, 6-10 squamous epithelial cells.  Urine culture sent. Wet prep with WBC and clue cells.  Gc/chlamydia pending.  CT renal scan No definite urinary stone disease although a 3 x 5 mm stone in the right pelvis could potentially be in the distal right ureter. No secondary changes in the right kidney or ureter to support this, but ureteral anatomy and venous anatomy in this region is not well demonstrated and it cannot be determined whether this is a phlebolith or  renal stone. Follow-up postcontrast CT with delayed imaging through the pelvis (hematuria protocol) could be used to further evaluate as clinically warranted.  Clinically, I suspect that the patient has a kidney stone. She has had significant improvement with toradol and zofran in the ED. She has been able to tolerate PO. I will give rx for pain medication, nausea meds, and flomax. Will have her f/u with urology. Advised to return if worse. She voices understanding and is in agreement with plan. All questions answered, pt stable for d/c.   ED Discharge Orders         Ordered    HYDROcodone-acetaminophen (NORCO/VICODIN) 5-325 MG tablet  Every 4 hours PRN     02/13/19 0952    ondansetron (ZOFRAN) 4 MG tablet  Every 6 hours     02/13/19 0952    tamsulosin (FLOMAX) 0.4 MG CAPS capsule  Daily     02/13/19 0952    metroNIDAZOLE (FLAGYL) 500 MG tablet  2 times daily     02/13/19 1022    potassium chloride (K-DUR) 10 MEQ tablet  Daily     02/13/19 [address] Navin Dogan S, PA-C 02/13/19 1023    Rolan Bucco, MD 02/13/19 1034

## 2019-02-13 NOTE — Discharge Instructions (Addendum)
Today you were diagnosed with a suspected kidney stone on your CT scan.  You will be given a prescription for Flomax, pain medication, and nausea medication.  You should not drive, work, or operate machinery while taking the pain medication as it can make you very drowsy.  You will need to follow-up with urology for reevaluation and for further treatment of your kidney stone.  You will need to return to the emergency department for any fevers, persistent pain, persistent vomiting, inability to urinate, or any new or worsening symptoms.

## 2019-02-14 LAB — URINE CULTURE

## 2019-02-16 LAB — CERVICOVAGINAL ANCILLARY ONLY
Chlamydia: NEGATIVE
Neisseria Gonorrhea: NEGATIVE

## 2023-12-19 HISTORY — PX: WISDOM TOOTH EXTRACTION: SHX21

## 2024-04-05 ENCOUNTER — Encounter (HOSPITAL_COMMUNITY): Payer: Self-pay | Admitting: Obstetrics & Gynecology

## 2024-04-05 NOTE — Progress Notes (Signed)
 Spoke w/ via phone for pre-op interview--- pt Lab needs dos----      upt   Lab results------ lab appt 04-08-2024 @ 0900 getting CBC/ CMP/ T&S COVID test -----patient states asymptomatic no test needed Arrive at ------- 0530 on 04-12-2024 NPO after MN w/ exception sips of water w/ meds Pre-Surgery Ensure or G2: n/a  Med rec completed Medications to take morning of surgery ----- none Diabetic medication ----- n/a  GLP1 agonist last dose:  n/a GLP1 instructions:  Patient instructed no nail polish to be worn day of surgery Patient instructed to bring photo id and insurance card day of surgery Patient aware to have Driver (ride ) / caregiver    for 24 hours after surgery -  mother, chemical engineer Patient Special Instructions ----- will pick up soap and written instructions at lab appt Pre-Op special Instructions ----- n/a  Patient verbalized understanding of instructions that were given at this phone interview. Patient denies chest pain, sob, fever, cough at the interview.

## 2024-04-05 NOTE — Pre-Procedure Instructions (Signed)
 Surgical Instructions  Your procedure is scheduled on :   Monday,  04-12-2024 Report to [facility] Main Entrance A at 5:30 AM, then check in the Admitting office. Any questions or running late day of surgery :  call [phone]  Questions prior to your surgery day:  call [phone], Monday -- Friday 8am - 4pm. If you experience any cold or flu symptoms such as cough, fever, chills, shortness of breath, etc. between now and you scheduled surgery, please notify your surgeon office.   Remember: Do not eat any food after midnight the night before surgery. This includes No water,  candy,  gum, and mints.  Take these medicines the morning of surgery with A SIPS OF WATER:   NONE   May take these medicines IF NEEDED:   NONE   One week prior to surgery, STOP taking any Aspirin (unless otherwise instructed by your surgeon) Aleve, Naproxen, ibuprofen, Motrin, Advil, Goody's, BC's, all herbal medications/ supplements, fish oil, and non-prescription vitamins.  Do NOT Smoke (tobacco/ vaping) and Do Not drink alcohol for 24 hours prior to your procedure.  For those patients that use a CPAP.  Please bring your CPAP/ mask/ tubing with them day of surgery . Anesthesia may ask recovery room nurse to use and if you stay the night you be asked to use it.  You will be asked to removed any contacts, glasses, piercing's, hearing aid's, dentures/ partials prior to surgery.  Please bring cases/ container/ solution/ etc., for them day of surgery.   Patients discharged the day of surgery will NOT be allowed to drive home.  You must have responsible driver and caregiver to stay at home with you the next 24 hours.  SURGICAL WAITING ROOM VISITATION Patients may have no more than 2 support people in the waiting area - if more than 2 , these visitors may rotate.  Pre-op nurse will coordinate an appropriate time for 1 Adult support person, who may not rotate, to accompany patient in pre-op.  Aware some patients may  have certain circumstances, speak to pre-op nurse day of surgery.  Children under the age 67 must have an adult with them who is not the patient and must remain in the main waiting area with an adult.  If the patient needs to stay at the hospital during part of their recovery, the visitor guidelines for inpatient rooms apply.  Please refer to the [facility] website for the visitor guidelines for any additional information.  If you received a COVID test during your pre-op visit it is requested that you wear a mask when out in public, stay away from anyone that may not be feeling well and notify your surgeon if you develop symptoms.  If you have been in contact with anyone that has tested positive in the past 10 days notify your surgeon.     [facility] - Preparing for Surgery  Before surgery, you can play an important role. Because skin is not sterile, it needs to be as free of germs as possible. You can reduce the number of germs on your skin by washing with CHG (chlorhexidine gluconate) soap before surgery. CHG is an antiseptic cleaner which kills germs and bonds with the skin to continue killing germs even after washing. Oral hygiene is also important in reducing the risk of infection. Remember to brush your teeth with your regular toothpaste the morning of surgery.  Please DO NOT use if you have an allergy to CHG or antibacterial soaps. If your  skin becomes reddened/irritated stop using the CHG and inform your Pre-op nurse day of surgery.  DO NOT shave (including legs and genital area) for at least 48 hours prior to your CHG shower.   Please follow these instructions carefully:  Shower with CHG soap the night before surgery. If you choose to wash your hair, wash your hair first as usual with your normal shampoo. After you shampoo, rinse your hair and body thoroughly to remove the shampoo. Use CHG as you would any other liquid soap. You can apply CHG directly to the skin and wash gently  with a clean washcloth or shower sponge. Apply the CHG soap to your body ONLY FROM THE NECK DOWN. Do not use on open wounds or open sores. Avoid contact with your eyes, ears, mouth, and genitals (private parts). Wash genitals (private parts) with your normal soap. Wash thoroughly, paying special attention to the area where your surgery will be performed. Thoroughly rinse your body with warm water from the neck down. DO NOT shower/wash with your normal soap after using and rinsing off the CHG soap. DO NOT use lotions, oils, etc., after showering with CHG. Pat yourself dry with a clean towel. Wear clean pajamas. Place clean sheets on your bed the night of your CHG shower and do not sleep with pets.  Day of Surgery  DO NOT Apply any lotions,  powder,  oils,  deodorants (may use underarm deodorant),  cologne/  perfumes  or makeup Do Not wear jewelry /  piercing's/  metal/  permanent jewelry must be removed prior to arrival day of surgery. (No plastic piercing) Do Not wear nail polish,  gel polish,  artificial nails, or any other type of covering on natural finger nails (toe nails are okay) Remember to brush your teeth and rinse mouth out. Put on clean / comfortable clothes. [facility] is not responsible for valuables/ personal belongings

## 2024-04-08 ENCOUNTER — Encounter (HOSPITAL_COMMUNITY)
Admission: RE | Admit: 2024-04-08 | Discharge: 2024-04-08 | Disposition: A | Discharge status: Home / Self Care | Source: Ambulatory Visit | Attending: Obstetrics & Gynecology | Admitting: Obstetrics & Gynecology

## 2024-04-08 DIAGNOSIS — D219 Benign neoplasm of connective and other soft tissue, unspecified: Secondary | ICD-10-CM | POA: Diagnosis not present

## 2024-04-08 DIAGNOSIS — Z01812 Encounter for preprocedural laboratory examination: Secondary | ICD-10-CM | POA: Diagnosis present

## 2024-04-08 LAB — COMPREHENSIVE METABOLIC PANEL WITH GFR
ALT: 11 U/L (ref 0–44)
AST: 18 U/L (ref 15–41)
Albumin: 3.7 g/dL (ref 3.5–5.0)
Alkaline Phosphatase: 70 U/L (ref 38–126)
Anion gap: 9 (ref 5–15)
BUN: 10 mg/dL (ref 6–20)
CO2: 27 mmol/L (ref 22–32)
Calcium: 9.3 mg/dL (ref 8.9–10.3)
Chloride: 102 mmol/L (ref 98–111)
Creatinine, Ser: 0.63 mg/dL (ref 0.44–1.00)
GFR, Estimated: >60 mL/min (ref >60)
Glucose, Bld: 68 mg/dL — ABNORMAL LOW (ref 70–99)
Potassium: 3.8 mmol/L (ref 3.5–5.1)
Sodium: 138 mmol/L (ref 135–145)
Total Bilirubin: 0.9 mg/dL (ref 0.0–1.2)
Total Protein: 6.8 g/dL (ref 6.5–8.1)

## 2024-04-08 LAB — CBC
HCT: 36.8 % (ref 36.0–46.0)
Hemoglobin: 12.1 g/dL (ref 12.0–15.0)
MCH: 30.2 pg (ref 26.0–34.0)
MCHC: 32.9 g/dL (ref 30.0–36.0)
MCV: 91.8 fL (ref 80.0–100.0)
Platelets: 231 K/uL (ref 150–400)
RBC: 4.01 MIL/uL (ref 3.87–5.11)
RDW: 12.8 % (ref 11.5–15.5)
WBC: 4.2 K/uL (ref 4.0–10.5)
nRBC: 0 % (ref 0.0–0.2)

## 2024-04-08 LAB — TYPE AND SCREEN
ABO/RH(D): O POS
Antibody Screen: NEGATIVE

## 2024-04-10 NOTE — H&P (Signed)
 Donna Faulkner is an 28 y.o. female G0 with symptomatic uterine leiomyomata.  Patient c/o pelvic pain and pressure.  Last u/s 01/13/24 showed multiple fibroids: SS-9.8 x 6.6 cm (fundal), 6.8 x 4.4 cm, 2.7 cm, 2.1 cm and 1.7 cm and IM-2.8 cm.  Patient desires to preserve child-bearing potential.  She presents for abdominal myomectomy.    Pertinent Gynecological History: Menses: flow is moderate Bleeding: regular Contraception: abstinence DES exposure: unknown Blood transfusions: none Sexually transmitted diseases: no past history Previous GYN Procedures: none  Last mammogram: n/a Date: n/a Last pap: normal Date: 07/03/22 OB History: G0   Menstrual History: Menarche age: n/a Patient's last menstrual period was 03/23/2024 (approximate).    Past Medical History:  Diagnosis Date   History of kidney stones 2020   04-05-2024  unsure if passed or not   Leiomyoma of uterus    symptomatic   Wears contact lenses     Past Surgical History:  Procedure Laterality Date   NO PAST SURGERIES     WISDOM TOOTH EXTRACTION  12/2023    History reviewed. No pertinent family history.  Social History:  reports that she has never smoked. She has never used smokeless tobacco. She reports current alcohol use of about 1.0 standard drink of alcohol per week. She reports that she does not use drugs.  Allergies: No Known Allergies  No medications prior to admission.    Review of Systems  Height 5' 2 (1.575 m), weight 60.4 kg, last menstrual period 03/23/2024. Physical Exam Constitutional:      Appearance: Normal appearance.  HENT:     Head: Normocephalic and atraumatic.  Pulmonary:     Effort: Pulmonary effort is normal.  Abdominal:     Palpations: Abdomen is soft.  Musculoskeletal:        General: Normal range of motion.     Cervical back: Normal range of motion.  Skin:    General: Skin is warm and dry.  Neurological:     Mental Status: She is alert and oriented to person, place, and  time.  Psychiatric:        Mood and Affect: Mood normal.        Behavior: Behavior normal.     No results found for this or any previous visit (from the past 24 hours).  No results found.  Assessment/Plan: 28yo G0 with symptomatic uterine leiomyomata -Abdominal myomectomy -Patient is counseled re: risk of bleeding, infection, scarring and damage to surrounding structures.  She is informed of steps of procedure as well as postop expectations and limitations.  She is counseled re: need to delay conception by 12-18 mo and will need C/S delivery at early term.  All questions were answered and patient wishes to proceed.  Duwaine Blumenthal 04/10/2024, 3:23 PM  No change in H&P.  Will proceed with abdominal myomectomy.  Duwaine Blumenthal, DO

## 2024-04-12 ENCOUNTER — Observation Stay (HOSPITAL_COMMUNITY)

## 2024-04-12 ENCOUNTER — Encounter (HOSPITAL_COMMUNITY): Admission: RE | Disposition: A | Payer: Self-pay | Source: Home / Self Care | Attending: Obstetrics & Gynecology

## 2024-04-12 ENCOUNTER — Observation Stay (HOSPITAL_COMMUNITY)
Admission: RE | Admit: 2024-04-12 | Discharge: 2024-04-13 | Disposition: A | Attending: Obstetrics & Gynecology | Admitting: Obstetrics & Gynecology

## 2024-04-12 ENCOUNTER — Other Ambulatory Visit: Payer: Self-pay

## 2024-04-12 ENCOUNTER — Encounter (HOSPITAL_COMMUNITY): Payer: Self-pay | Admitting: Obstetrics & Gynecology

## 2024-04-12 DIAGNOSIS — D252 Subserosal leiomyoma of uterus: Secondary | ICD-10-CM | POA: Insufficient documentation

## 2024-04-12 DIAGNOSIS — D251 Intramural leiomyoma of uterus: Principal | ICD-10-CM | POA: Insufficient documentation

## 2024-04-12 DIAGNOSIS — D219 Benign neoplasm of connective and other soft tissue, unspecified: Principal | ICD-10-CM

## 2024-04-12 DIAGNOSIS — D259 Leiomyoma of uterus, unspecified: Secondary | ICD-10-CM | POA: Diagnosis present

## 2024-04-12 DIAGNOSIS — Z01818 Encounter for other preprocedural examination: Secondary | ICD-10-CM

## 2024-04-12 HISTORY — DX: Leiomyoma of uterus, unspecified: D25.9

## 2024-04-12 HISTORY — PX: MYOMECTOMY: SHX85

## 2024-04-12 HISTORY — DX: Presence of spectacles and contact lenses: Z97.3

## 2024-04-12 LAB — POCT PREGNANCY, URINE: Preg Test, Ur: NEGATIVE

## 2024-04-12 LAB — ABO/RH: ABO/RH(D): O POS

## 2024-04-12 SURGERY — MYOMECTOMY, ABDOMINAL APPROACH
Anesthesia: General

## 2024-04-12 MED ORDER — 0.9 % SODIUM CHLORIDE (POUR BTL) OPTIME
TOPICAL | Status: DC | PRN
  Administered 2024-04-12 (×2): 1000 mL

## 2024-04-12 MED ORDER — MEPERIDINE HCL 50 MG/ML IJ SOLN
6.2500 mg | INTRAMUSCULAR | Status: DC | PRN
  Filled 2024-04-12: qty 1

## 2024-04-12 MED ORDER — KETOROLAC TROMETHAMINE 30 MG/ML IJ SOLN
INTRAMUSCULAR | Status: AC
  Filled 2024-04-12: qty 1

## 2024-04-12 MED ORDER — HYDROMORPHONE HCL 1 MG/ML IJ SOLN
INTRAMUSCULAR | Status: AC
  Filled 2024-04-12: qty 1

## 2024-04-12 MED ORDER — PROPOFOL 10 MG/ML IV BOLUS
INTRAVENOUS | Status: DC | PRN
  Administered 2024-04-12: 200 mg via INTRAVENOUS

## 2024-04-12 MED ORDER — KETAMINE HCL 50 MG/5ML IJ SOSY
PREFILLED_SYRINGE | INTRAMUSCULAR | Status: AC
  Filled 2024-04-12: qty 5

## 2024-04-12 MED ORDER — LIDOCAINE 2% (20 MG/ML) 5 ML SYRINGE
INTRAMUSCULAR | Status: DC | PRN
  Administered 2024-04-12: 60 mg via INTRAVENOUS

## 2024-04-12 MED ORDER — CHLORHEXIDINE GLUCONATE 0.12 % MT SOLN
15.0000 mL | Freq: Once | OROMUCOSAL | Status: AC
  Administered 2024-04-12: 15 mL via OROMUCOSAL

## 2024-04-12 MED ORDER — PROPOFOL 10 MG/ML IV BOLUS
INTRAVENOUS | Status: AC
  Filled 2024-04-12: qty 20

## 2024-04-12 MED ORDER — ACETAMINOPHEN 500 MG PO TABS
1000.0000 mg | ORAL_TABLET | Freq: Once | ORAL | Status: AC
  Administered 2024-04-12: 1000 mg via ORAL

## 2024-04-12 MED ORDER — DOCUSATE SODIUM 100 MG PO CAPS
100.0000 mg | ORAL_CAPSULE | Freq: Two times a day (BID) | ORAL | Status: DC
  Administered 2024-04-12 – 2024-04-13 (×2): 100 mg via ORAL
  Filled 2024-04-12 (×2): qty 1

## 2024-04-12 MED ORDER — FENTANYL CITRATE (PF) 250 MCG/5ML IJ SOLN
INTRAMUSCULAR | Status: DC | PRN
Start: 2024-04-12 — End: 2024-04-12
  Administered 2024-04-12: 50 ug via INTRAVENOUS
  Administered 2024-04-12: 100 ug via INTRAVENOUS

## 2024-04-12 MED ORDER — AMISULPRIDE (ANTIEMETIC) 5 MG/2ML IV SOLN: 10.0000 mg | Freq: Once | INTRAVENOUS | Status: DC | PRN

## 2024-04-12 MED ORDER — CHLORHEXIDINE GLUCONATE 0.12 % MT SOLN
OROMUCOSAL | Status: AC
Start: 2024-04-12 — End: 2024-04-12
  Filled 2024-04-12: qty 15

## 2024-04-12 MED ORDER — ORAL CARE MOUTH RINSE: 15.0000 mL | Freq: Once | OROMUCOSAL | Status: AC

## 2024-04-12 MED ORDER — LACTATED RINGERS IV SOLN: INTRAVENOUS | Status: DC

## 2024-04-12 MED ORDER — CEFAZOLIN SODIUM-DEXTROSE 2-4 GM/100ML-% IV SOLN
INTRAVENOUS | Status: AC
  Filled 2024-04-12: qty 100

## 2024-04-12 MED ORDER — HYDROMORPHONE HCL 1 MG/ML IJ SOLN: 0.2000 mg | INTRAMUSCULAR | Status: DC | PRN

## 2024-04-12 MED ORDER — MENTHOL 3 MG MT LOZG: 1.0000 | LOZENGE | OROMUCOSAL | Status: DC | PRN

## 2024-04-12 MED ORDER — LACTATED RINGERS IV SOLN: INTRAVENOUS | Status: AC

## 2024-04-12 MED ORDER — SCOPOLAMINE 1 MG/3DAYS TD PT72
MEDICATED_PATCH | TRANSDERMAL | Status: AC
  Filled 2024-04-12: qty 1

## 2024-04-12 MED ORDER — SUGAMMADEX SODIUM 200 MG/2ML IV SOLN
INTRAVENOUS | Status: DC | PRN
Start: 2024-04-12 — End: 2024-04-12
  Administered 2024-04-12: 200 mg via INTRAVENOUS

## 2024-04-12 MED ORDER — FENTANYL CITRATE (PF) 250 MCG/5ML IJ SOLN
INTRAMUSCULAR | Status: AC
  Filled 2024-04-12: qty 5

## 2024-04-12 MED ORDER — ONDANSETRON HCL 4 MG/2ML IJ SOLN
INTRAMUSCULAR | Status: DC | PRN
  Administered 2024-04-12: 4 mg via INTRAVENOUS

## 2024-04-12 MED ORDER — HYDROMORPHONE HCL 1 MG/ML IJ SOLN
0.2500 mg | INTRAMUSCULAR | Status: DC | PRN
  Administered 2024-04-12 (×3): 0.5 mg via INTRAVENOUS

## 2024-04-12 MED ORDER — OXYCODONE HCL 5 MG PO TABS
5.0000 mg | ORAL_TABLET | ORAL | Status: DC | PRN
  Administered 2024-04-12 – 2024-04-13 (×3): 10 mg via ORAL
  Filled 2024-04-12 (×3): qty 2

## 2024-04-12 MED ORDER — ROCURONIUM BROMIDE 10 MG/ML (PF) SYRINGE
PREFILLED_SYRINGE | INTRAVENOUS | Status: DC | PRN
  Administered 2024-04-12: 60 mg via INTRAVENOUS

## 2024-04-12 MED ORDER — MIDAZOLAM HCL 2 MG/2ML IJ SOLN
INTRAMUSCULAR | Status: AC
  Filled 2024-04-12: qty 2

## 2024-04-12 MED ORDER — ONDANSETRON HCL 4 MG PO TABS: 4.0000 mg | ORAL_TABLET | Freq: Four times a day (QID) | ORAL | Status: DC | PRN

## 2024-04-12 MED ORDER — OXYCODONE HCL 5 MG/5ML PO SOLN: 5.0000 mg | Freq: Once | ORAL | Status: DC | PRN

## 2024-04-12 MED ORDER — KETAMINE HCL 10 MG/ML IJ SOLN
INTRAMUSCULAR | Status: DC | PRN
  Administered 2024-04-12: 10 mg via INTRAVENOUS
  Administered 2024-04-12: 20 mg via INTRAVENOUS

## 2024-04-12 MED ORDER — ACETAMINOPHEN 500 MG PO TABS
ORAL_TABLET | ORAL | Status: AC
  Filled 2024-04-12: qty 2

## 2024-04-12 MED ORDER — LIDOCAINE 2% (20 MG/ML) 5 ML SYRINGE
INTRAMUSCULAR | Status: AC
Start: 2024-04-12 — End: 2024-04-12
  Filled 2024-04-12: qty 5

## 2024-04-12 MED ORDER — OXYCODONE HCL 5 MG PO TABS: 5.0000 mg | ORAL_TABLET | Freq: Once | ORAL | Status: DC | PRN

## 2024-04-12 MED ORDER — ONDANSETRON HCL 4 MG/2ML IJ SOLN: 4.0000 mg | Freq: Once | INTRAMUSCULAR | Status: DC | PRN

## 2024-04-12 MED ORDER — DEXAMETHASONE SOD PHOSPHATE PF 10 MG/ML IJ SOLN
INTRAMUSCULAR | Status: DC | PRN
  Administered 2024-04-12: 10 mg via INTRAVENOUS

## 2024-04-12 MED ORDER — ACETAMINOPHEN 500 MG PO TABS
1000.0000 mg | ORAL_TABLET | Freq: Four times a day (QID) | ORAL | Status: DC
  Administered 2024-04-12 – 2024-04-13 (×4): 1000 mg via ORAL
  Filled 2024-04-12 (×5): qty 2

## 2024-04-12 MED ORDER — ONDANSETRON HCL 4 MG/2ML IJ SOLN
INTRAMUSCULAR | Status: AC
Start: 2024-04-12 — End: 2024-04-12
  Filled 2024-04-12: qty 2

## 2024-04-12 MED ORDER — SIMETHICONE 80 MG PO CHEW: 80.0000 mg | CHEWABLE_TABLET | Freq: Four times a day (QID) | ORAL | Status: DC | PRN

## 2024-04-12 MED ORDER — MIDAZOLAM HCL (PF) 2 MG/2ML IJ SOLN
INTRAMUSCULAR | Status: DC | PRN
  Administered 2024-04-12: 2 mg via INTRAVENOUS

## 2024-04-12 MED ORDER — SCOPOLAMINE 1 MG/3DAYS TD PT72
1.0000 | MEDICATED_PATCH | TRANSDERMAL | Status: DC
  Administered 2024-04-12: 1 mg via TRANSDERMAL

## 2024-04-12 MED ORDER — ROCURONIUM BROMIDE 10 MG/ML (PF) SYRINGE
PREFILLED_SYRINGE | INTRAVENOUS | Status: AC
  Filled 2024-04-12: qty 10

## 2024-04-12 MED ORDER — PHENYLEPHRINE 80 MCG/ML (10ML) SYRINGE FOR IV PUSH (FOR BLOOD PRESSURE SUPPORT)
PREFILLED_SYRINGE | INTRAVENOUS | Status: DC | PRN
  Administered 2024-04-12: 80 ug via INTRAVENOUS
  Administered 2024-04-12: 40 ug via INTRAVENOUS

## 2024-04-12 MED ORDER — KETOROLAC TROMETHAMINE 30 MG/ML IJ SOLN
30.0000 mg | Freq: Once | INTRAMUSCULAR | Status: AC | PRN
  Administered 2024-04-12: 30 mg via INTRAVENOUS

## 2024-04-12 MED ORDER — CEFAZOLIN SODIUM-DEXTROSE 2-4 GM/100ML-% IV SOLN
2.0000 g | INTRAVENOUS | Status: AC
  Administered 2024-04-12: 2 g via INTRAVENOUS

## 2024-04-12 MED ORDER — ONDANSETRON HCL 4 MG/2ML IJ SOLN
4.0000 mg | Freq: Four times a day (QID) | INTRAMUSCULAR | Status: DC | PRN
Start: 2024-04-12 — End: 2024-04-13

## 2024-04-12 MED ORDER — POVIDONE-IODINE 10 % EX SWAB
2.0000 | Freq: Once | CUTANEOUS | Status: AC
  Administered 2024-04-12: 2 via TOPICAL

## 2024-04-12 MED ORDER — KETOROLAC TROMETHAMINE 15 MG/ML IJ SOLN
15.0000 mg | Freq: Four times a day (QID) | INTRAMUSCULAR | Status: DC
  Administered 2024-04-12 – 2024-04-13 (×4): 15 mg via INTRAVENOUS
  Filled 2024-04-12 (×4): qty 1

## 2024-04-12 SURGICAL SUPPLY — 30 items
BARRIER ADHS 3X4 INTERCEED (GAUZE/BANDAGES/DRESSINGS) IMPLANT
BENZOIN TINCTURE PRP APPL 2/3 (GAUZE/BANDAGES/DRESSINGS) ×1 IMPLANT
CLSR STERI-STRIP ANTIMIC 1/2X4 (GAUZE/BANDAGES/DRESSINGS) IMPLANT
DRAPE CESAREAN BIRTH W POUCH (DRAPES) ×1 IMPLANT
DRSG OPSITE POSTOP 4X10 (GAUZE/BANDAGES/DRESSINGS) ×1 IMPLANT
DRSG OPSITE POSTOP 4X12 (GAUZE/BANDAGES/DRESSINGS) IMPLANT
DURAPREP 26ML APPLICATOR (WOUND CARE) ×1 IMPLANT
GAUZE 4X4 16PLY ~~LOC~~+RFID DBL (SPONGE) IMPLANT
GLOVE BIOGEL PI IND STRL 6 (GLOVE) ×1 IMPLANT
GLOVE BIOGEL PI IND STRL 7.0 (GLOVE) ×2 IMPLANT
GLOVE SS PI 5.5 STRL (GLOVE) ×1 IMPLANT
GOWN STRL REUS W/ TWL LRG LVL3 (GOWN DISPOSABLE) ×2 IMPLANT
KIT TURNOVER KIT B (KITS) ×1 IMPLANT
LIGASURE IMPACT 36 18CM CVD LR (INSTRUMENTS) IMPLANT
MANIFOLD NEPTUNE II (INSTRUMENTS) ×1 IMPLANT
PACK ABDOMINAL GYN (CUSTOM PROCEDURE TRAY) ×1 IMPLANT
PAD ARMBOARD POSITIONER FOAM (MISCELLANEOUS) ×1 IMPLANT
PAD OB MATERNITY 11 LF (PERSONAL CARE ITEMS) ×1 IMPLANT
SOLN 0.9% NACL POUR BTL 1000ML (IV SOLUTION) ×1 IMPLANT
SPONGE T-LAP 18X18 ~~LOC~~+RFID (SPONGE) IMPLANT
STRIP CLOSURE SKIN 1/2X4 (GAUZE/BANDAGES/DRESSINGS) ×1 IMPLANT
SUT MNCRL 0 MO-4 VIOLET 18 CR (SUTURE) ×1 IMPLANT
SUT MON AB 2-0 CT1 36 (SUTURE) ×1 IMPLANT
SUT MON AB-0 CT1 36 (SUTURE) ×1 IMPLANT
SUT PDS AB 3-0 SH 27 (SUTURE) ×1 IMPLANT
SUT VIC AB 0 CT1 36 (SUTURE) ×1 IMPLANT
SUT VIC AB 4-0 KS 27 (SUTURE) ×1 IMPLANT
SUT VICRYL 0 TIES 12 18 (SUTURE) IMPLANT
TOWEL GREEN STERILE FF (TOWEL DISPOSABLE) ×2 IMPLANT
TRAY FOLEY W/BAG SLVR 14FR (SET/KITS/TRAYS/PACK) ×1 IMPLANT

## 2024-04-12 NOTE — Anesthesia Procedure Notes (Signed)
 Procedure Name: Intubation Date/Time: 04/12/2024 7:19 AM  Performed by: Jencarlo Bonadonna C, CRNAPre-anesthesia Checklist: Patient identified, Emergency Drugs available, Suction available and Patient being monitored Patient Re-evaluated:Patient Re-evaluated prior to induction Oxygen Delivery Method: Circle system utilized Preoxygenation: Pre-oxygenation with 100% oxygen Induction Type: IV induction Ventilation: Mask ventilation without difficulty Laryngoscope Size: Mac and 3 Grade View: Grade I Tube type: Oral Tube size: 7.0 mm Number of attempts: 1 Airway Equipment and Method: Stylet and Oral airway Placement Confirmation: ETT inserted through vocal cords under direct vision, positive ETCO2 and breath sounds checked- equal and bilateral Secured at: 22 cm Tube secured with: Tape Dental Injury: Teeth and Oropharynx as per pre-operative assessment

## 2024-04-12 NOTE — Transfer of Care (Signed)
 Immediate Anesthesia Transfer of Care Note  Patient: Donna Faulkner  Procedure(s) Performed: MYOMECTOMY, ABDOMINAL APPROACH  Patient Location: PACU  Anesthesia Type:General  Level of Consciousness: awake and sedated  Airway & Oxygen Therapy: Patient Spontanous Breathing and Patient connected to face mask oxygen  Post-op Assessment: Report given to RN and Post -op Vital signs reviewed and stable  Post vital signs: Reviewed and stable  Last Vitals:  Vitals Value Taken Time  BP 125/70 04/12/24 08:54  Temp 36.4 C 04/12/24 08:53  Pulse 91 04/12/24 08:57  Resp 23 04/12/24 08:57  SpO2 100 % 04/12/24 08:57  Vitals shown include unfiled device data.  Last Pain:  Vitals:   04/12/24 0555  TempSrc: Oral  PainSc: 0-No pain      Patients Stated Pain Goal: 6 (04/12/24 0555)  Complications: No notable events documented.

## 2024-04-12 NOTE — Op Note (Signed)
 Donna Faulkner PROCEDURE DATE: 04/12/2024  PREOPERATIVE DIAGNOSIS:  Symptomatic fibroids POSTOPERATIVE DIAGNOSIS:  Symptomatic fibroids SURGEON:   Dr. Duwaine Blumenthal ASSISTANT:   Dr. Norleen Skill OPERATION:  Abdominal myomectomy ANESTHESIA:  General endotracheal.  INDICATIONS: The patient is a 28 y.o. G0 with history of symptomatic uterine fibroids. The patient made a decision to undergo definite surgical treatment. On the preoperative visit, the risks, benefits, indications, and alternatives of the procedure were reviewed with the patient.  On the day of surgery, the risks of surgery were again discussed with the patient including but not limited to: bleeding which may require transfusion or reoperation; infection which may require antibiotics; injury to bowel, bladder, ureters or other surrounding organs; need for additional procedures; thromboembolic phenomenon, incisional problems and other postoperative/anesthesia complications. Written informed consent was obtained.    OPERATIVE FINDINGS: A 16 week size uterus with normal tubes and ovaries bilaterally.  ESTIMATED BLOOD LOSS: 250 ml SPECIMENS:  6 fibroids sent to pathology COMPLICATIONS:  None immediate.   DESCRIPTION OF PROCEDURE: The patient received intravenous antibiotics and had sequential compression devices applied to her lower extremities while in the preoperative area.   She was taken to the operating room and placed under general anesthesia without difficulty and found to be adequate.The abdomen and perineum were prepped and draped in a sterile manner, and a Foley catheter was inserted into the bladder and attached to constant drainage. After an adequate timeout was performed, a Pfannensteil skin incision was made. This incision was taken down to the fascia using electrocautery with care given to maintain good hemostasis. The fascia was incised in the midline and the fascial incision was then extended bilaterally using electrocautery  without difficulty. The fascia was then dissected off the underlying rectus muscles using blunt and sharp dissection. The rectus muscles were split bluntly in the midline and the peritoneum entered sharply without complication. This peritoneal incision was then extended superiorly and inferiorly with care given to prevent bowel or bladder injury. Upon entry into the abdominal cavity, the upper abdomen was inspected and found to be normal. Attention was then turned to the pelvis. The uterus at this point was noted to be mobilized and was exteriorized. Fibroids were identified to assist with best approach for removal.  Subserosal fibroids x 5 were removed using Ligasure.  Serosa was closed using figure of eight 0 monocryl stitches.  These were located at the fundus x 3, posterior-left x 1 and anterior right x 1.  There was a single intramural fibroid located at anterior right fundal region.  Incision was made in the serosa using Bovie cautery in A-P dimension.  Using blunt dissection, the fibroid was removed.  The deep incision was closed using figure of eight 0 monocryl stitch.  The serosa was closed using figure of eight 0 monocryl stitches.  Inspection of bilateral fallopian tubes confirmed no compromise.  Hemostasis was observed.  The pelvis was irrigated and hemostasis was reconfirmed at all incisions.  Intercede was applied to uterine fundus covering incisions. All laparotomy sponges and instruments were removed from the abdomen. The peritoneum was closed with 2-0 monocryl, and the fascia was closed with 0 Vicryl in a running fashion. The skin was closed with a 4-0 Monocryl subcuticular stitch. Sponge, lap, needle, and instrument counts were correct times two. The patient was taken to the recovery area awake, extubated and in stable condition.

## 2024-04-12 NOTE — Anesthesia Preprocedure Evaluation (Signed)
 Anesthesia Evaluation  Patient identified by MRN, date of birth, ID band Patient awake    Reviewed: Allergy & Precautions, H&P , NPO status , Patient's Chart, lab work & pertinent test results  Airway Mallampati: II  TM Distance: >3 FB Neck ROM: Full    Dental  (+) Teeth Intact, Dental Advisory Given   Pulmonary neg pulmonary ROS   Pulmonary exam normal breath sounds clear to auscultation       Cardiovascular negative cardio ROS Normal cardiovascular exam Rhythm:Regular Rate:Normal     Neuro/Psych negative neurological ROS  negative psych ROS   GI/Hepatic negative GI ROS, Neg liver ROS,,,  Endo/Other  negative endocrine ROS    Renal/GU negative Renal ROS  negative genitourinary   Musculoskeletal negative musculoskeletal ROS (+)    Abdominal   Peds negative pediatric ROS (+)  Hematology negative hematology ROS (+)   Anesthesia Other Findings   Reproductive/Obstetrics negative OB ROS                              Anesthesia Physical Anesthesia Plan  ASA: 2  Anesthesia Plan: General   Post-op Pain Management: Tylenol  PO (pre-op)*, Toradol  IV (intra-op)*, Precedex, Ketamine  IV* and Dilaudid  IV   Induction: Intravenous  PONV Risk Score and Plan: 4 or greater and Ondansetron , Dexamethasone , Midazolam , Scopolamine  patch - Pre-op and Treatment may vary due to age or medical condition  Airway Management Planned: Oral ETT  Additional Equipment: None  Intra-op Plan:   Post-operative Plan: Extubation in OR  Informed Consent: I have reviewed the patients History and Physical, chart, labs and discussed the procedure including the risks, benefits and alternatives for the proposed anesthesia with the patient or authorized representative who has indicated his/her understanding and acceptance.     Dental advisory given  Plan Discussed with: CRNA  Anesthesia Plan Comments:          Anesthesia Quick Evaluation

## 2024-04-12 NOTE — Progress Notes (Signed)
*   Day of Surgery * Procedure(s) (LRB): MYOMECTOMY, ABDOMINAL APPROACH (N/A)  Subjective: Patient reports incisional pain and tolerating PO.    Objective: I have reviewed patient's vital signs, intake and output, and medications.  General: alert, cooperative, and appears stated age Extremities: extremities normal, atraumatic, no cyanosis or edema Abd: soft, ND.  Dressing 50% stained with dark blood  Assessment: s/p Procedure(s): MYOMECTOMY, ABDOMINAL APPROACH (N/A): stable and progressing well  Plan: Advance diet Encourage ambulation Advance to PO medication Replace honeycomb dressing D/C IVF and Foley in AM AM labs pending  LOS: 0 days    Donna Blumenthal, DO 04/12/2024, 1:53 PM

## 2024-04-12 NOTE — Anesthesia Postprocedure Evaluation (Signed)
 Anesthesia Post Note  Patient: Donna Faulkner  Procedure(s) Performed: MYOMECTOMY, ABDOMINAL APPROACH     Patient location during evaluation: PACU Anesthesia Type: General Level of consciousness: awake and alert, oriented and patient cooperative Pain management: pain level controlled Vital Signs Assessment: post-procedure vital signs reviewed and stable Respiratory status: spontaneous breathing, nonlabored ventilation and respiratory function stable Cardiovascular status: blood pressure returned to baseline and stable Postop Assessment: no apparent nausea or vomiting Anesthetic complications: no   No notable events documented.  Last Vitals:  Vitals:   04/12/24 1038 04/12/24 1111  BP: 126/87 133/87  Pulse: 82 87  Resp: 17 18  Temp: 36.6 C 36.4 C  SpO2: 99% 97%    Last Pain:  Vitals:   04/12/24 1111  TempSrc: Oral  PainSc: 6                  [name]

## 2024-04-13 ENCOUNTER — Encounter (HOSPITAL_COMMUNITY): Payer: Self-pay | Admitting: Obstetrics & Gynecology

## 2024-04-13 DIAGNOSIS — D251 Intramural leiomyoma of uterus: Secondary | ICD-10-CM | POA: Diagnosis not present

## 2024-04-13 LAB — COMPREHENSIVE METABOLIC PANEL WITH GFR
ALT: 11 U/L (ref 0–44)
AST: 16 U/L (ref 15–41)
Albumin: 2.9 g/dL — ABNORMAL LOW (ref 3.5–5.0)
Alkaline Phosphatase: 51 U/L (ref 38–126)
Anion gap: 8 (ref 5–15)
BUN: <5 mg/dL — ABNORMAL LOW (ref 6–20)
CO2: 24 mmol/L (ref 22–32)
Calcium: 8.8 mg/dL — ABNORMAL LOW (ref 8.9–10.3)
Chloride: 106 mmol/L (ref 98–111)
Creatinine, Ser: 0.69 mg/dL (ref 0.44–1.00)
GFR, Estimated: >60 mL/min (ref >60)
Glucose, Bld: 128 mg/dL — ABNORMAL HIGH (ref 70–99)
Potassium: 3.5 mmol/L (ref 3.5–5.1)
Sodium: 138 mmol/L (ref 135–145)
Total Bilirubin: 1 mg/dL (ref 0.0–1.2)
Total Protein: 5.5 g/dL — ABNORMAL LOW (ref 6.5–8.1)

## 2024-04-13 LAB — CBC
HCT: 27.3 % — ABNORMAL LOW (ref 36.0–46.0)
Hemoglobin: 9.3 g/dL — ABNORMAL LOW (ref 12.0–15.0)
MCH: 30.5 pg (ref 26.0–34.0)
MCHC: 34.1 g/dL (ref 30.0–36.0)
MCV: 89.5 fL (ref 80.0–100.0)
Platelets: 175 K/uL (ref 150–400)
RBC: 3.05 MIL/uL — ABNORMAL LOW (ref 3.87–5.11)
RDW: 12.9 % (ref 11.5–15.5)
WBC: 8.7 K/uL (ref 4.0–10.5)
nRBC: 0 % (ref 0.0–0.2)

## 2024-04-13 LAB — SURGICAL PATHOLOGY

## 2024-04-13 MED ORDER — IBUPROFEN 600 MG PO TABS: 600.0000 mg | ORAL_TABLET | Freq: Four times a day (QID) | ORAL | 0 refills | Status: AC | PRN

## 2024-04-13 MED ORDER — OXYCODONE HCL 5 MG PO TABS: 5.0000 mg | ORAL_TABLET | ORAL | 0 refills | Status: AC | PRN

## 2024-04-13 NOTE — Discharge Instructions (Signed)
 Call MD for T>100.4, heavy vaginal bleeding, severe abdominal pain, intractable nausea and/or vomiting, or respiratory distress.  Call office to schedule postop visit in 2 weeks.  Pelvic rest and no heavy lifting.  No driving while taking narcotics.  Removed dressing on Saturday.

## 2024-04-13 NOTE — Progress Notes (Signed)
 1 Day Post-Op Procedure(s) (LRB): MYOMECTOMY, ABDOMINAL APPROACH (N/A)  Subjective: Patient reports tolerating PO.  Pain well controlled with po meds.  Ambulating without difficulty.  No void yet since foley removed.  Objective: I have reviewed patient's vital signs, intake and output, medications, and labs.  General: alert, cooperative, and appears stated age Extremities: extremities normal, atraumatic, no cyanosis or edema Abd: soft, ND.  Dressing c/d.  Assessment: s/p Procedure(s): MYOMECTOMY, ABDOMINAL APPROACH (N/A): progressing well  Plan: Once able to void, will d/c home.   LOS: 0 days    Duwaine Blumenthal, DO 04/13/2024, 7:44 AM

## 2024-04-13 NOTE — Progress Notes (Signed)
Pt discharged home in stable condition after discharge instructions given. Pt verbalized understanding and all questions were answered. IV was discontinued and pt was sent home with all belongings.

## 2024-04-13 NOTE — Discharge Summary (Signed)
 Physician Discharge Summary  Patient ID: Donna Faulkner MRN: [id number] DOB/AGE: [birth year] 28 y.o.  Admit date: 04/12/2024 Discharge date: 04/13/2024  Admission Diagnoses: Symptomatic uterine leiomyomata  Discharge Diagnoses:  Principal Problem:   Uterine leiomyoma   Discharged Condition: good  Hospital Course: Patient was admitted on 11/24 for anticipated abdominal myomectomy.  This procedure was completed without complication.  Please see operative note for details.  Patient had uncomplicated postop course.  On POD1, patient was meeting all goals and was discharge home.  Consults: None  Significant Diagnostic Studies: none  Treatments: surgery: abdominal myomectomy  Discharge Exam: Blood pressure 114/61, pulse 80, temperature 98.3 F (36.8 C), temperature source Oral, resp. rate 15, height 5' 2 (1.575 m), weight 60.4 kg, last menstrual period 03/23/2024, SpO2 99%. General appearance: alert, cooperative, and appears stated age Extremities: extremities normal, atraumatic, no cyanosis or edema Abd: soft, ND.  Dressing c/d  Disposition: Discharge disposition: 01-Home or Self Care        Allergies as of 04/13/2024   No Known Allergies      Medication List     TAKE these medications    cetirizine 5 MG tablet Commonly known as: ZYRTEC Take 5 mg by mouth daily.   ibuprofen  600 MG tablet Commonly known as: ADVIL  Take 1 tablet (600 mg total) by mouth every 6 (six) hours as needed. What changed:  medication strength how much to take   Multivitamin Gummies Womens Chew Chew 2 each by mouth 3 (three) times a week.   oxyCODONE  5 MG immediate release tablet Commonly known as: Oxy IR/ROXICODONE  Take 1 tablet (5 mg total) by mouth every 4 (four) hours as needed for up to 7 days for moderate pain (pain score 4-6).         Signed: Duwaine Blumenthal 04/13/2024, 7:47 AM
